# Patient Record
Sex: Male | Born: 1960 | Race: Black or African American | Hispanic: No | Marital: Single | Smoking: Never smoker
Health system: Southern US, Community
[De-identification: ages and names within clinical notes are randomized; demographics above are authoritative.]

## PROBLEM LIST (undated history)

## (undated) DIAGNOSIS — N200 Calculus of kidney: Secondary | ICD-10-CM

## (undated) HISTORY — PX: ANKLE SURGERY: SHX546

## (undated) HISTORY — PX: KNEE ARTHROPLASTY: SHX992

---

## 1998-03-09 ENCOUNTER — Emergency Department (HOSPITAL_COMMUNITY): Admission: EM | Admit: 1998-03-09 | Discharge: 1998-03-09 | Payer: Self-pay | Admitting: Emergency Medicine

## 2000-10-04 ENCOUNTER — Emergency Department (HOSPITAL_COMMUNITY): Admission: EM | Admit: 2000-10-04 | Discharge: 2000-10-04 | Payer: Self-pay | Admitting: *Deleted

## 2005-10-06 ENCOUNTER — Encounter: Admission: RE | Admit: 2005-10-06 | Discharge: 2005-10-06 | Payer: Self-pay | Admitting: Family Medicine

## 2005-10-12 ENCOUNTER — Encounter: Admission: RE | Admit: 2005-10-12 | Discharge: 2006-01-10 | Payer: Self-pay | Admitting: Family Medicine

## 2005-12-07 ENCOUNTER — Emergency Department (HOSPITAL_COMMUNITY): Admission: EM | Admit: 2005-12-07 | Discharge: 2005-12-07 | Payer: Self-pay | Admitting: Emergency Medicine

## 2005-12-08 ENCOUNTER — Ambulatory Visit (HOSPITAL_COMMUNITY): Admission: EM | Admit: 2005-12-08 | Discharge: 2005-12-08 | Payer: Self-pay | Admitting: Emergency Medicine

## 2011-04-03 ENCOUNTER — Emergency Department (HOSPITAL_COMMUNITY)
Admission: EM | Admit: 2011-04-03 | Discharge: 2011-04-03 | Disposition: A | Discharge status: Home / Self Care | Payer: BC Managed Care – PPO | Attending: Emergency Medicine | Admitting: Emergency Medicine

## 2011-04-03 ENCOUNTER — Emergency Department (HOSPITAL_COMMUNITY): Payer: BC Managed Care – PPO

## 2011-04-03 DIAGNOSIS — R112 Nausea with vomiting, unspecified: Secondary | ICD-10-CM | POA: Insufficient documentation

## 2011-04-03 DIAGNOSIS — R1915 Other abnormal bowel sounds: Secondary | ICD-10-CM | POA: Insufficient documentation

## 2011-04-03 DIAGNOSIS — N201 Calculus of ureter: Secondary | ICD-10-CM | POA: Insufficient documentation

## 2011-04-03 DIAGNOSIS — I10 Essential (primary) hypertension: Secondary | ICD-10-CM | POA: Insufficient documentation

## 2011-04-03 DIAGNOSIS — R109 Unspecified abdominal pain: Secondary | ICD-10-CM | POA: Insufficient documentation

## 2011-04-03 LAB — COMPREHENSIVE METABOLIC PANEL
ALT: 45 U/L (ref 0–53)
AST: 25 U/L (ref 0–37)
Calcium: 8.9 mg/dL (ref 8.4–10.5)
Sodium: 139 mEq/L (ref 135–145)
Total Protein: 6.7 g/dL (ref 6.0–8.3)

## 2011-04-03 LAB — CBC
MCH: 29.4 pg (ref 26.0–34.0)
MCHC: 34.7 g/dL (ref 30.0–36.0)
Platelets: 282 10*3/uL (ref 150–400)
RBC: 4.66 MIL/uL (ref 4.22–5.81)

## 2011-04-03 LAB — URINALYSIS, ROUTINE W REFLEX MICROSCOPIC
Glucose, UA: NEGATIVE mg/dL
Specific Gravity, Urine: 1.023 (ref 1.005–1.030)
Urobilinogen, UA: 0.2 mg/dL (ref 0.0–1.0)
pH: 6 (ref 5.0–8.0)

## 2011-04-03 LAB — DIFFERENTIAL
Basophils Absolute: 0 10*3/uL (ref 0.0–0.1)
Basophils Relative: 0 % (ref 0–1)
Eosinophils Absolute: 0.1 10*3/uL (ref 0.0–0.7)
Monocytes Absolute: 0.7 10*3/uL (ref 0.1–1.0)
Monocytes Relative: 6 % (ref 3–12)
Neutrophils Relative %: 72 % (ref 43–77)

## 2011-04-03 LAB — URINE MICROSCOPIC-ADD ON

## 2011-04-05 ENCOUNTER — Ambulatory Visit (HOSPITAL_COMMUNITY)
Admission: RE | Admit: 2011-04-05 | Discharge: 2011-04-05 | Disposition: A | Discharge status: Home / Self Care | Payer: BC Managed Care – PPO | Source: Ambulatory Visit | Attending: Urology | Admitting: Urology

## 2011-04-05 DIAGNOSIS — N201 Calculus of ureter: Secondary | ICD-10-CM | POA: Insufficient documentation

## 2011-04-05 DIAGNOSIS — N1330 Unspecified hydronephrosis: Secondary | ICD-10-CM | POA: Insufficient documentation

## 2011-04-11 NOTE — Op Note (Signed)
  NAMEGLOYD, HAPP NO.:  [id number]  MEDICAL RECORD NO.:  [id number]  LOCATION:  DAYL                         FACILITY:  [facility]  PHYSICIAN:  Martina Sinner, MD DATE OF BIRTH:  08/03/1961  DATE OF PROCEDURE:  04/05/2011 DATE OF DISCHARGE:                              OPERATIVE REPORT   PREOPERATIVE DIAGNOSIS:  Right ureteral stone.  POSTOPERATIVE DIAGNOSIS:  Right ureteral stone.  PROCEDURE:  Cystoscopy, right retrograde urethrogram, and insertion of right ureteral stent.  DESCRIPTION OF PROCEDURE:  Mr. Voiles has symptomatic colic not settling down on pain and nausea medication.  He has a 5-mm stone in the proximal right ureter with mild hydronephrosis.  He consented to the above procedure.  The patient was prepped and draped in usual fashion.  Preoperative ciprofloxacin was given.  A 21 scope was utilized.  Penile bulbar membranous urethra normal.  Bladder mucosa and trigone were normal. There was no periureteral edema.  Under fluoroscopic guidance, I easily passed a sensor wire to the mid ureter.  Over the wire, I passed an open-ended 6-French ureteral catheter and removed the wire.  I did retrograde urethrogram with the above technique in the AP position and lithotomy position.  Approximately 5 mL of contrast was utilized, not under pressure.  I could see a minimally dilated ureter and I could outline the calyces.  I then replaced the sensor wire and had a curl in the upper pole calyx under fluoroscopic guidance.  So, I removed the open-ended ureteral catheter.  Under cystoscopic and fluoroscopic guidance, I passed a 26 cm x 6-French double-J stent with no string, that curled up in the upper pole calyx into the bladder.  Bladder was emptied.  The patient taken to recovery room.  The patient will be followed with KUB and likely lithotripsy.          ______________________________ Martina Sinner, MD     SAM/MEDQ  D:  04/05/2011   T:  04/06/2011  Job:  [id number]  Electronically Signed by Alfredo Martinez MD on 04/11/2011 08:00:00 AM

## 2011-04-13 ENCOUNTER — Ambulatory Visit (HOSPITAL_COMMUNITY)
Admission: RE | Admit: 2011-04-13 | Discharge: 2011-04-13 | Disposition: A | Discharge status: Home / Self Care | Payer: BC Managed Care – PPO | Source: Ambulatory Visit | Attending: Urology | Admitting: Urology

## 2011-04-13 DIAGNOSIS — I10 Essential (primary) hypertension: Secondary | ICD-10-CM | POA: Insufficient documentation

## 2011-04-13 DIAGNOSIS — N200 Calculus of kidney: Secondary | ICD-10-CM | POA: Insufficient documentation

## 2011-04-13 DIAGNOSIS — Z79899 Other long term (current) drug therapy: Secondary | ICD-10-CM | POA: Insufficient documentation

## 2012-11-18 IMAGING — CT CT ABD-PELV W/O CM
3 of 4 series · 14 of 32 positions shown, 19 images · non-contrast
Comparison: CT 12/07/2005

CLINICAL DATA: Right sided pain

CT ABDOMEN AND PELVIS WITHOUT CONTRAST
TECHNIQUE: Multidetector CT imaging of the abdomen and pelvis was
performed following the standard protocol without intravenous
contrast.

[Series 2: renal stone · axial · 0.79mm/px · z∈[-413,-153]mm · 4 of 88 slices shown, 9 images]
[im 18/88  soft-tissue]
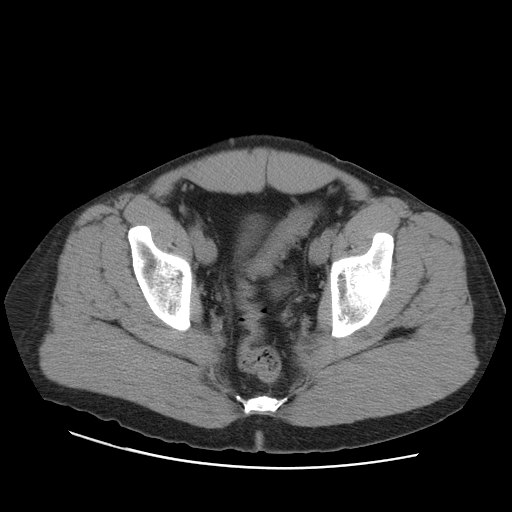
[im 18/88  lung]
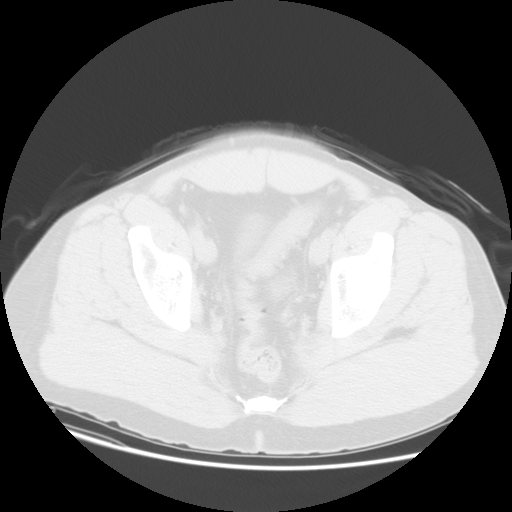
[im 18/88  bone]
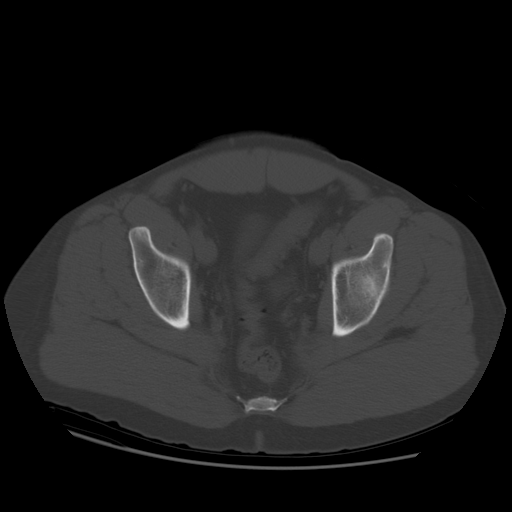
[im 35/88  soft-tissue]
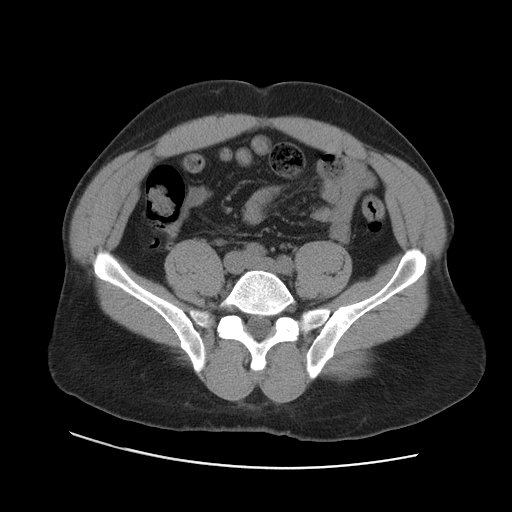
[im 35/88  lung]
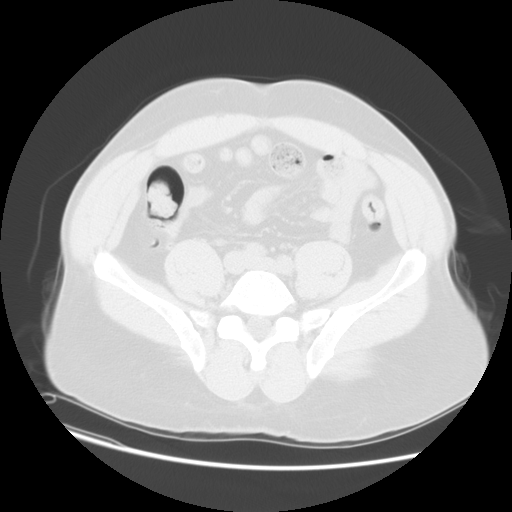
[im 53/88  soft-tissue]
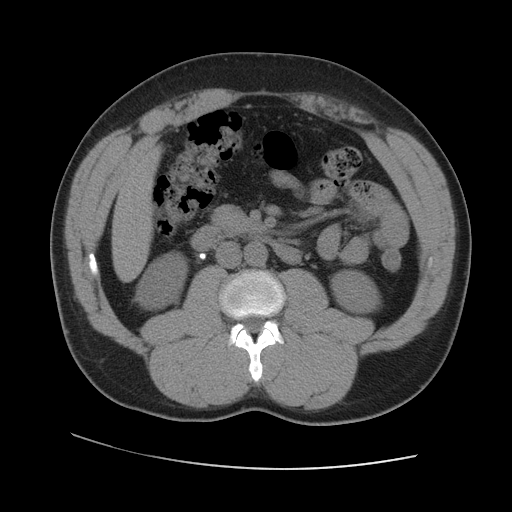
[im 53/88  lung]
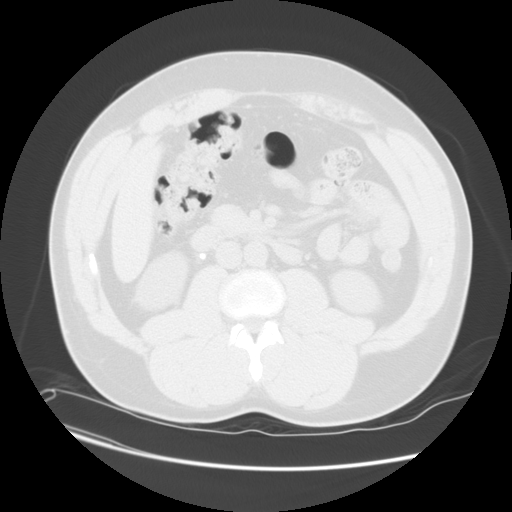
[im 70/88  soft-tissue]
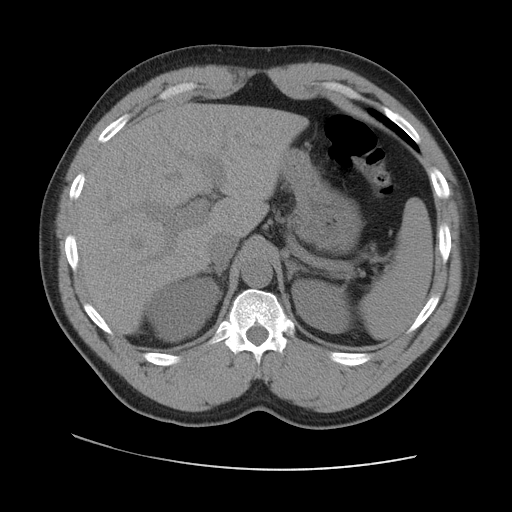
[im 70/88  lung]
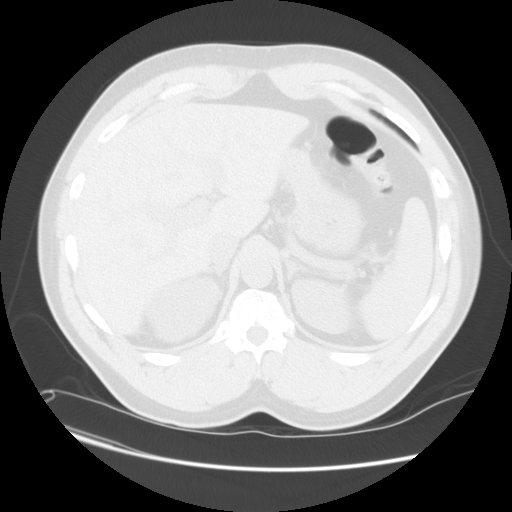

[Series 400: cor · coronal · 0.92mm/px · 2 of 149 slices shown]
[im 15/149  soft-tissue]
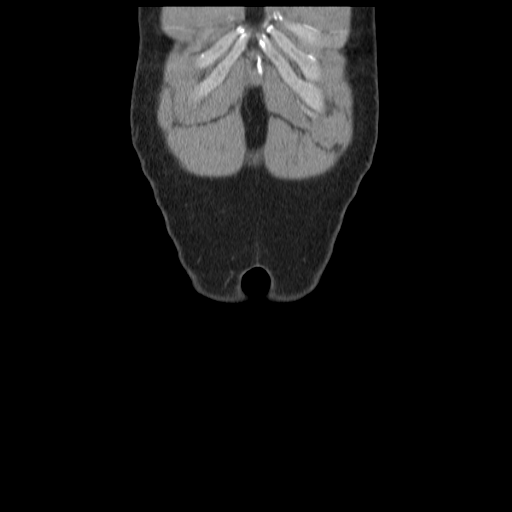
[im 30/149  soft-tissue]
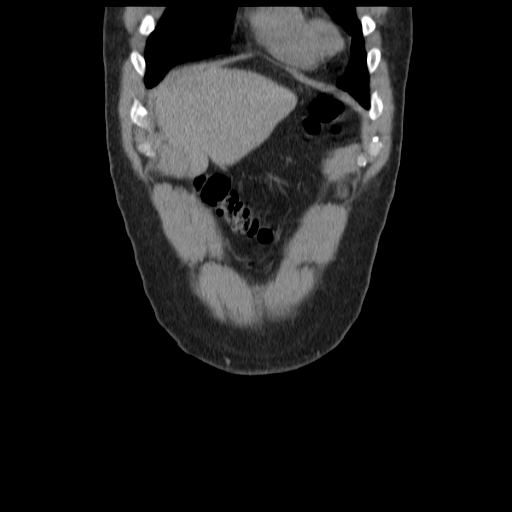

[Series 401: sag · sagittal · 0.92mm/px · 8 of 168 slices shown]
[im 14/168  soft-tissue]
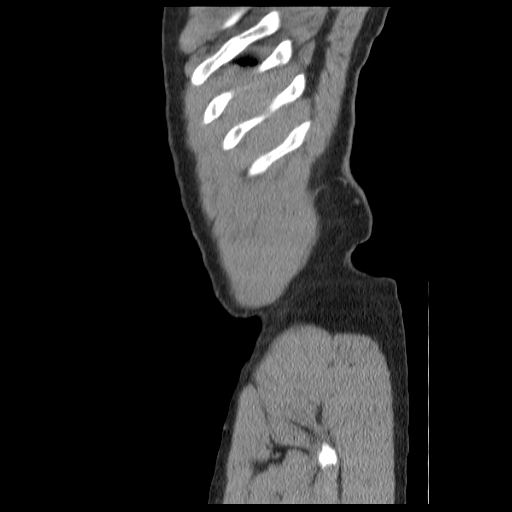
[im 42/168  soft-tissue]
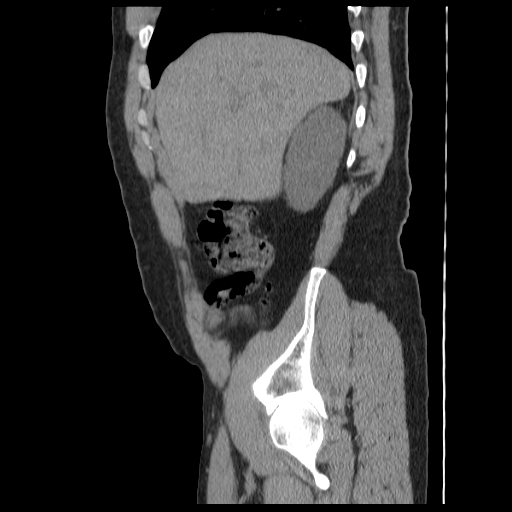
[im 56/168  soft-tissue]
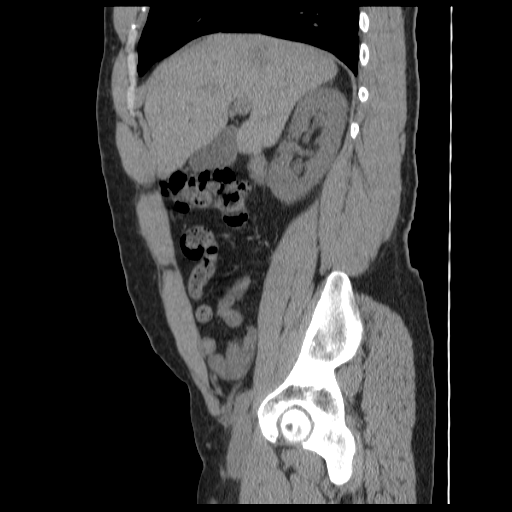
[im 70/168  soft-tissue]
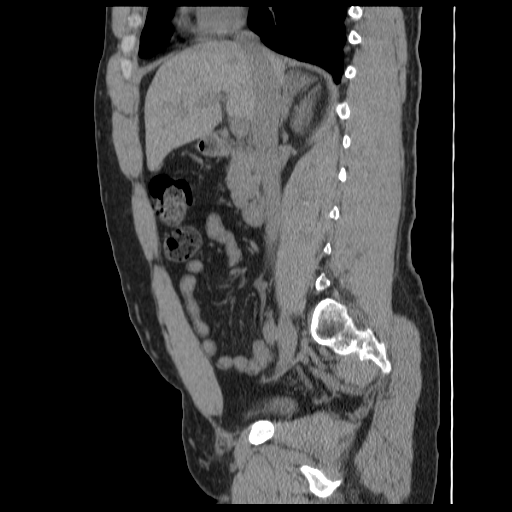
[im 98/168  soft-tissue]
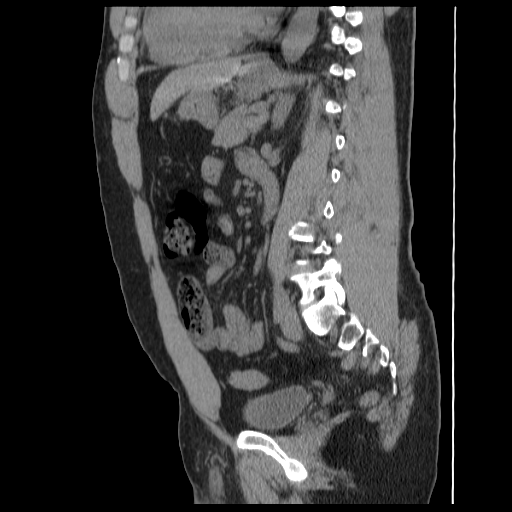
[im 112/168  soft-tissue]
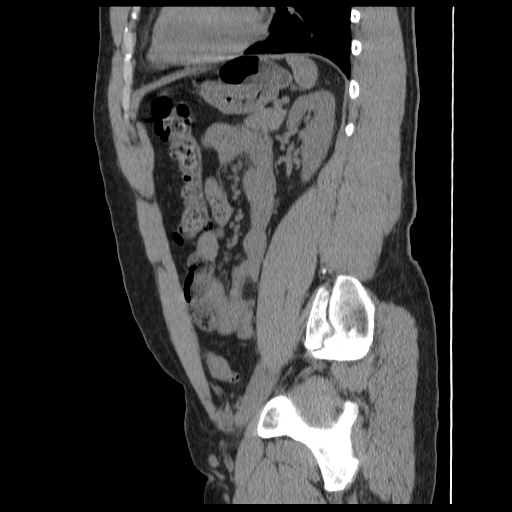
[im 126/168  soft-tissue]
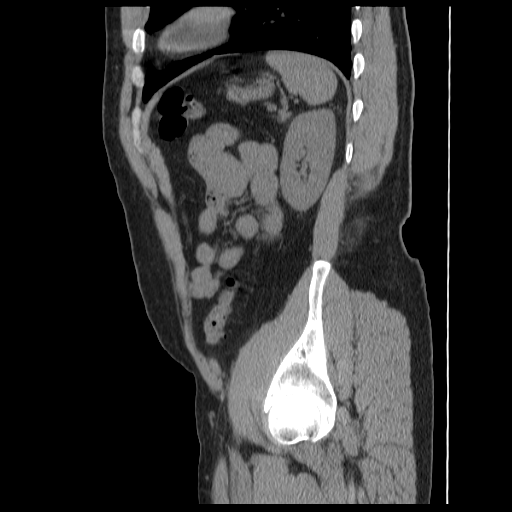
[im 154/168  soft-tissue]
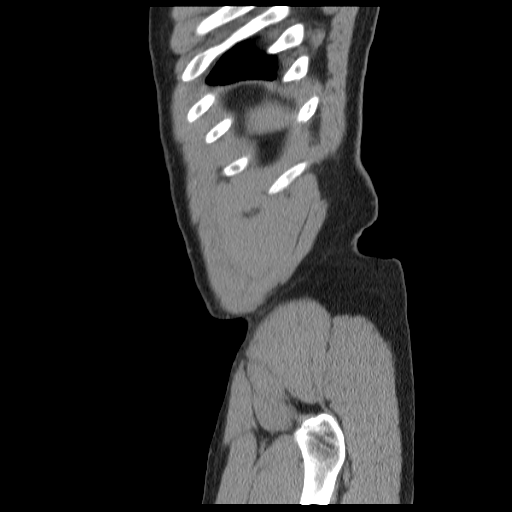

[14 of 32 positions shown; findings below may reference images not displayed]

FINDINGS: 4 x 5 mm calculus proximal right ureter causing partial
obstruction of the right kidney.  3 x 4 mm right lower pole stone
as well as smaller renal calculi bilaterally.  No obstruction of
the left kidney.

Liver spleen and pancreas are normal.  No bowel obstruction.
Appendix is normal.  Chronic diverticular change in the sigmoid
colon.
IMPRESSION: 4 x 5 mm calculus proximal right ureter causing partial obstruction
of the right kidney.

## 2013-10-23 ENCOUNTER — Emergency Department (HOSPITAL_COMMUNITY): Payer: BC Managed Care – PPO

## 2013-10-23 ENCOUNTER — Encounter (HOSPITAL_COMMUNITY): Payer: Self-pay | Admitting: Emergency Medicine

## 2013-10-23 DIAGNOSIS — J40 Bronchitis, not specified as acute or chronic: Secondary | ICD-10-CM | POA: Insufficient documentation

## 2013-10-23 DIAGNOSIS — M549 Dorsalgia, unspecified: Secondary | ICD-10-CM | POA: Insufficient documentation

## 2013-10-23 DIAGNOSIS — B9789 Other viral agents as the cause of diseases classified elsewhere: Secondary | ICD-10-CM | POA: Insufficient documentation

## 2013-10-23 DIAGNOSIS — Z8701 Personal history of pneumonia (recurrent): Secondary | ICD-10-CM | POA: Insufficient documentation

## 2013-10-23 LAB — I-STAT TROPONIN, ED: Troponin i, poc: 0.01 ng/mL (ref 0.00–0.08)

## 2013-10-23 NOTE — ED Notes (Signed)
Pt. reports mid chest pain and lower back pain onset yesterday with nausea and vomitting .

## 2013-10-24 ENCOUNTER — Emergency Department (HOSPITAL_COMMUNITY)
Admission: EM | Admit: 2013-10-24 | Discharge: 2013-10-24 | Disposition: A | Discharge status: Home / Self Care | Payer: BC Managed Care – PPO | Attending: Emergency Medicine | Admitting: Emergency Medicine

## 2013-10-24 DIAGNOSIS — J40 Bronchitis, not specified as acute or chronic: Secondary | ICD-10-CM

## 2013-10-24 DIAGNOSIS — B349 Viral infection, unspecified: Secondary | ICD-10-CM

## 2013-10-24 LAB — BASIC METABOLIC PANEL
BUN: 10 mg/dL (ref 6–23)
CHLORIDE: 101 meq/L (ref 96–112)
CO2: 28 mEq/L (ref 19–32)
Calcium: 9.3 mg/dL (ref 8.4–10.5)
Creatinine, Ser: 1.4 mg/dL — ABNORMAL HIGH (ref 0.50–1.35)
GFR, EST AFRICAN AMERICAN: 66 mL/min — AB (ref >90)
GFR, EST NON AFRICAN AMERICAN: 57 mL/min — AB (ref >90)
GLUCOSE: 116 mg/dL — AB (ref 70–99)
POTASSIUM: 4 meq/L (ref 3.7–5.3)
SODIUM: 141 meq/L (ref 137–147)

## 2013-10-24 LAB — CBC
HEMATOCRIT: 41.2 % (ref 39.0–52.0)
HEMOGLOBIN: 14.4 g/dL (ref 13.0–17.0)
MCH: 30.4 pg (ref 26.0–34.0)
MCHC: 35 g/dL (ref 30.0–36.0)
MCV: 86.9 fL (ref 78.0–100.0)
Platelets: 220 10*3/uL (ref 150–400)
RBC: 4.74 MIL/uL (ref 4.22–5.81)
RDW: 12.3 % (ref 11.5–15.5)
WBC: 5.2 10*3/uL (ref 4.0–10.5)

## 2013-10-24 MED ORDER — HYDROCOD POLST-CHLORPHEN POLST 10-8 MG/5ML PO LQCR
5.0000 mL | Freq: Once | ORAL | Status: AC
  Administered 2013-10-24: 5 mL via ORAL
  Filled 2013-10-24: qty 5

## 2013-10-24 MED ORDER — HYDROCOD POLST-CHLORPHEN POLST 10-8 MG/5ML PO LQCR: 5.0000 mL | Freq: Two times a day (BID) | ORAL | Status: DC | PRN

## 2013-10-24 MED ORDER — BENZONATATE 100 MG PO CAPS
200.0000 mg | ORAL_CAPSULE | Freq: Three times a day (TID) | ORAL | Status: DC | PRN
  Administered 2013-10-24: 200 mg via ORAL
  Filled 2013-10-24: qty 2

## 2013-10-24 MED ORDER — ONDANSETRON 8 MG PO TBDP: 8.0000 mg | ORAL_TABLET | Freq: Three times a day (TID) | ORAL | Status: AC | PRN

## 2013-10-24 MED ORDER — BENZONATATE 100 MG PO CAPS: 100.0000 mg | ORAL_CAPSULE | Freq: Three times a day (TID) | ORAL | Status: DC

## 2013-10-24 MED ORDER — ALBUTEROL SULFATE HFA 108 (90 BASE) MCG/ACT IN AERS
1.0000 | INHALATION_SPRAY | RESPIRATORY_TRACT | Status: DC | PRN
  Administered 2013-10-24: 1 via RESPIRATORY_TRACT
  Filled 2013-10-24: qty 6.7

## 2013-10-24 MED ORDER — ONDANSETRON HCL 4 MG/2ML IJ SOLN
4.0000 mg | Freq: Once | INTRAMUSCULAR | Status: AC
  Administered 2013-10-24: 4 mg via INTRAVENOUS
  Filled 2013-10-24: qty 2

## 2013-10-24 NOTE — Discharge Instructions (Signed)
Take medications as prescribed.  Alternate tylenol and motrin every 4-6 hours as needed for body aches, fever.  Drink plenty of fluids.  Rest!!   Bronchitis Bronchitis is inflammation of the airways that extend from the windpipe into the lungs (bronchi). The inflammation often causes mucus to develop, which leads to a cough. If the inflammation becomes severe, it may cause shortness of breath. CAUSES  Bronchitis may be caused by:   Viral infections.   Bacteria.   Cigarette smoke.   Allergens, pollutants, and other irritants.  SIGNS AND SYMPTOMS  The most common symptom of bronchitis is a frequent cough that produces mucus. Other symptoms include:  Fever.   Body aches.   Chest congestion.   Chills.   Shortness of breath.   Sore throat.  DIAGNOSIS  Bronchitis is usually diagnosed through a medical history and physical exam. Tests, such as chest X-rays, are sometimes done to rule out other conditions.  TREATMENT  You may need to avoid contact with whatever caused the problem (smoking, for example). Medicines are sometimes needed. These may include:  Antibiotics. These may be prescribed if the condition is caused by bacteria.  Cough suppressants. These may be prescribed for relief of cough symptoms.   Inhaled medicines. These may be prescribed to help open your airways and make it easier for you to breathe.   Steroid medicines. These may be prescribed for those with recurrent (chronic) bronchitis. HOME CARE INSTRUCTIONS  Get plenty of rest.   Drink enough fluids to keep your urine clear or pale yellow (unless you have a medical condition that requires fluid restriction). Increasing fluids may help thin your secretions and will prevent dehydration.   Only take over-the-counter or prescription medicines as directed by your health care provider.  Only take antibiotics as directed. Make sure you finish them even if you start to feel better.  Avoid secondhand  smoke, irritating chemicals, and strong fumes. These will make bronchitis worse. If you are a smoker, quit smoking. Consider using nicotine gum or skin patches to help control withdrawal symptoms. Quitting smoking will help your lungs heal faster.   Put a cool-mist humidifier in your bedroom at night to moisten the air. This may help loosen mucus. Change the water in the humidifier daily. You can also run the hot water in your shower and sit in the bathroom with the door closed for 5 10 minutes.   Follow up with your health care provider as directed.   Wash your hands frequently to avoid catching bronchitis again or spreading an infection to others.  SEEK MEDICAL CARE IF: Your symptoms do not improve after 1 week of treatment.  SEEK IMMEDIATE MEDICAL CARE IF:  Your fever increases.  You have chills.   You have chest pain.   You have worsening shortness of breath.   You have bloody sputum.  You faint.  You have lightheadedness.  You have a severe headache.   You vomit repeatedly. MAKE SURE YOU:   Understand these instructions.  Will watch your condition.  Will get help right away if you are not doing well or get worse. Document Released: 08/21/2005 Document Revised: 06/11/2013 Document Reviewed: 04/15/2013 [facility] Patient Information 2014 [employer], [place].  Viral Infections A viral infection can be caused by different types of viruses.Most viral infections are not serious and resolve on their own. However, some infections may cause severe symptoms and may lead to further complications. SYMPTOMS Viruses can frequently cause:  Minor sore throat.  Aches and pains.  Headaches.  Runny nose.  Different types of rashes.  Watery eyes.  Tiredness.  Cough.  Loss of appetite.  Gastrointestinal infections, resulting in nausea, vomiting, and diarrhea. These symptoms do not respond to antibiotics because the infection is not caused by bacteria. However, you  might catch a bacterial infection following the viral infection. This is sometimes called a "superinfection." Symptoms of such a bacterial infection may include:  Worsening sore throat with pus and difficulty swallowing.  Swollen neck glands.  Chills and a high or persistent fever.  Severe headache.  Tenderness over the sinuses.  Persistent overall ill feeling (malaise), muscle aches, and tiredness (fatigue).  Persistent cough.  Yellow, green, or brown mucus production with coughing. HOME CARE INSTRUCTIONS   Only take over-the-counter or prescription medicines for pain, discomfort, diarrhea, or fever as directed by your caregiver.  Drink enough water and fluids to keep your urine clear or pale yellow. Sports drinks can provide valuable electrolytes, sugars, and hydration.  Get plenty of rest and maintain proper nutrition. Soups and broths with crackers or rice are fine. SEEK IMMEDIATE MEDICAL CARE IF:   You have severe headaches, shortness of breath, chest pain, neck pain, or an unusual rash.  You have uncontrolled vomiting, diarrhea, or you are unable to keep down fluids.  You or your child has an oral temperature above 102 F (38.9 C), not controlled by medicine.  Your baby is older than 3 months with a rectal temperature of 102 F (38.9 C) or higher.  Your baby is 68 months old or younger with a rectal temperature of 100.4 F (38 C) or higher. MAKE SURE YOU:   Understand these instructions.  Will watch your condition.  Will get help right away if you are not doing well or get worse. Document Released: 05/31/2005 Document Revised: 11/13/2011 Document Reviewed: 12/26/2010 [facility] Patient Information 2014 [place], [place].

## 2013-10-24 NOTE — ED Notes (Signed)
Pt states that this morning he woke up with a headache, nasal congestion. Pt states that he then started experiencing back pain, chest pain and abd pain. States that he tried to drink some water and started vomitting. States that he had a temp of 102 at 1400 and took two motrin. Pts wife states that this afternoon pt was shaky and had cold chills.

## 2013-10-24 NOTE — ED Provider Notes (Signed)
CSN: [id number]     Arrival date & time 10/23/13  2302 History   First MD Initiated Contact with Patient 10/24/13 [phone]     Chief Complaint  Patient presents with  . Chest Pain  . Back Pain     (Consider location/radiation/quality/duration/timing/severity/associated sxs/prior Treatment) HPI 53 yo male presents to the ER from home with complaint of one day of cough, back and chest pain, headache, nasal congestion, post tussive emesis, and fever to 102.  Pt is not a smoker, no h/o asthma, COPD.  No known sick contacts.  Pt reports body aches, chills.  He is concerned about pneumonia, has remote history of same.   History reviewed. No pertinent past medical history. Past Surgical History  Procedure Laterality Date  . Ankle surgery     No family history on file. History  Substance Use Topics  . Smoking status: Never Smoker   . Smokeless tobacco: Not on file  . Alcohol Use: No    Review of Systems  All other systems reviewed and are negative.      Allergies  Review of patient's allergies indicates no known allergies.  Home Medications   Current Outpatient Rx  Name  Route  Sig  Dispense  Refill  . ibuprofen (ADVIL,MOTRIN) 200 MG tablet   Oral   Take 200 mg by mouth every 6 (six) hours as needed for mild pain.         . benzonatate (TESSALON) 100 MG capsule   Oral   Take 1 capsule (100 mg total) by mouth every 8 (eight) hours.   21 capsule   0   . chlorpheniramine-HYDROcodone (TUSSIONEX PENNKINETIC ER) 10-8 MG/5ML LQCR   Oral   Take 5 mLs by mouth every 12 (twelve) hours as needed for cough.   140 mL   0   . ondansetron (ZOFRAN ODT) 8 MG disintegrating tablet   Oral   Take 1 tablet (8 mg total) by mouth every 8 (eight) hours as needed for nausea or vomiting.   20 tablet   0    BP 151/89  Pulse 104  Temp(Src) 98.6 F (37 C) (Oral)  Resp 18  Ht 5\' 11"  (1.803 m)  Wt 225 lb (102.059 kg)  BMI 31.39 kg/m2  SpO2 96% Physical Exam  Nursing note and vitals  reviewed. Constitutional: He is oriented to person, place, and time. He appears well-developed and well-nourished. He appears distressed.  HENT:  Head: Normocephalic and atraumatic.  Right Ear: External ear normal.  Left Ear: External ear normal.  Mouth/Throat: Oropharynx is clear and moist.  Nasal congestion  Eyes: Conjunctivae and EOM are normal. Pupils are equal, round, and reactive to light.  Neck: Normal range of motion. Neck supple. No JVD present. No tracheal deviation present. No thyromegaly present.  Cardiovascular: Normal rate, regular rhythm, normal heart sounds and intact distal pulses.  Exam reveals no gallop and no friction rub.   No murmur heard. Pulmonary/Chest: Effort normal and breath sounds normal. No stridor. No respiratory distress. He has no wheezes. He has no rales. He exhibits no tenderness.  Coughing noted  Abdominal: Soft. Bowel sounds are normal. He exhibits no distension and no mass. There is no tenderness. There is no rebound and no guarding.  Musculoskeletal: Normal range of motion. He exhibits no edema and no tenderness.  Lymphadenopathy:    He has no cervical adenopathy.  Neurological: He is alert and oriented to person, place, and time. He exhibits normal muscle tone. Coordination normal.  Skin:  Skin is warm and dry. No rash noted. No erythema. No pallor.  Psychiatric: He has a normal mood and affect. His behavior is normal. Judgment and thought content normal.    ED Course  Procedures (including critical care time) Labs Review Labs Reviewed  BASIC METABOLIC PANEL - Abnormal; Notable for the following:    Glucose, Bld 116 (*)    Creatinine, Ser 1.40 (*)    GFR calc non Af Amer 57 (*)    GFR calc Af Amer 66 (*)    All other components within normal limits  CBC  I-STAT TROPOININ, ED   Imaging Review Dg Chest 2 View  10/24/2013   CLINICAL DATA:  CHEST PAIN BACK PAIN  EXAM: CHEST  2 VIEW  COMPARISON:  DG CERVICAL SPINE 2-3V dated 05/07/2012; DG CHEST  1V PORT dated 12/07/2005  FINDINGS: Normal mediastinum and cardiac silhouette. Chronic central bronchitic markings. Normal pulmonary vasculature. No effusion, infiltrate, or pneumothorax.  IMPRESSION: Chronic bronchitic markings.  No acute findings.   Electronically Signed   By: Genevive Bi M.D.   On: 10/24/2013 00:07    EKG Interpretation    Date/Time:  Thursday October 23 2013 23:09:06 EST Ventricular Rate:  104 PR Interval:  166 QRS Duration: 102 QT Interval:  346 QTC Calculation: 454 R Axis:   -77 Text Interpretation:  Sinus tachycardia Left axis deviation Pulmonary disease pattern Abnormal ECG Since last tracing rate faster Confirmed by Jabree Rebert  MD, Airabella Barley ([id number]) on 10/24/2013 1:22:57 AM            MDM   Final diagnoses:  Bronchitis  Viral syndrome    53 year old male with cough, congestion, and posttussive emesis.  Signs of bronchitis on chest x-ray.  Labs otherwise unremarkable.  Plan to treat symptomatically.  Patient is happy with this plan.    Olivia Mackie, MD 10/24/13 [phone]

## 2014-04-12 ENCOUNTER — Emergency Department (HOSPITAL_COMMUNITY): Payer: BC Managed Care – PPO

## 2014-04-12 ENCOUNTER — Emergency Department (HOSPITAL_COMMUNITY)
Admission: EM | Admit: 2014-04-12 | Discharge: 2014-04-12 | Disposition: A | Discharge status: Home / Self Care | Payer: BC Managed Care – PPO | Attending: Emergency Medicine | Admitting: Emergency Medicine

## 2014-04-12 ENCOUNTER — Encounter (HOSPITAL_COMMUNITY): Payer: Self-pay | Admitting: Emergency Medicine

## 2014-04-12 DIAGNOSIS — R319 Hematuria, unspecified: Secondary | ICD-10-CM

## 2014-04-12 DIAGNOSIS — R112 Nausea with vomiting, unspecified: Secondary | ICD-10-CM | POA: Insufficient documentation

## 2014-04-12 DIAGNOSIS — R0602 Shortness of breath: Secondary | ICD-10-CM | POA: Insufficient documentation

## 2014-04-12 DIAGNOSIS — Z87442 Personal history of urinary calculi: Secondary | ICD-10-CM | POA: Insufficient documentation

## 2014-04-12 DIAGNOSIS — R079 Chest pain, unspecified: Secondary | ICD-10-CM | POA: Insufficient documentation

## 2014-04-12 DIAGNOSIS — R61 Generalized hyperhidrosis: Secondary | ICD-10-CM | POA: Insufficient documentation

## 2014-04-12 DIAGNOSIS — I499 Cardiac arrhythmia, unspecified: Secondary | ICD-10-CM | POA: Insufficient documentation

## 2014-04-12 DIAGNOSIS — Z79899 Other long term (current) drug therapy: Secondary | ICD-10-CM | POA: Insufficient documentation

## 2014-04-12 DIAGNOSIS — R1084 Generalized abdominal pain: Secondary | ICD-10-CM | POA: Insufficient documentation

## 2014-04-12 DIAGNOSIS — R231 Pallor: Secondary | ICD-10-CM | POA: Insufficient documentation

## 2014-04-12 HISTORY — DX: Calculus of kidney: N20.0

## 2014-04-12 LAB — COMPREHENSIVE METABOLIC PANEL
ALT: 23 U/L (ref 0–53)
AST: 23 U/L (ref 0–37)
Albumin: 4.4 g/dL (ref 3.5–5.2)
Alkaline Phosphatase: 69 U/L (ref 39–117)
Anion gap: 14 (ref 5–15)
BILIRUBIN TOTAL: 0.5 mg/dL (ref 0.3–1.2)
BUN: 9 mg/dL (ref 6–23)
CALCIUM: 9.5 mg/dL (ref 8.4–10.5)
CHLORIDE: 104 meq/L (ref 96–112)
CO2: 25 meq/L (ref 19–32)
Creatinine, Ser: 1.34 mg/dL (ref 0.50–1.35)
GFR, EST AFRICAN AMERICAN: 69 mL/min — AB (ref >90)
GFR, EST NON AFRICAN AMERICAN: 60 mL/min — AB (ref >90)
Glucose, Bld: 129 mg/dL — ABNORMAL HIGH (ref 70–99)
Potassium: 4.1 mEq/L (ref 3.7–5.3)
SODIUM: 143 meq/L (ref 137–147)
Total Protein: 7.7 g/dL (ref 6.0–8.3)

## 2014-04-12 LAB — LIPASE, BLOOD: Lipase: 24 U/L (ref 11–59)

## 2014-04-12 LAB — URINALYSIS, ROUTINE W REFLEX MICROSCOPIC
Bilirubin Urine: NEGATIVE
GLUCOSE, UA: NEGATIVE mg/dL
Ketones, ur: 15 mg/dL — AB
Leukocytes, UA: NEGATIVE
Nitrite: NEGATIVE
Protein, ur: NEGATIVE mg/dL
SPECIFIC GRAVITY, URINE: 1.038 — AB (ref 1.005–1.030)
UROBILINOGEN UA: 0.2 mg/dL (ref 0.0–1.0)
pH: 8 (ref 5.0–8.0)

## 2014-04-12 LAB — CBC WITH DIFFERENTIAL/PLATELET
Basophils Absolute: 0 10*3/uL (ref 0.0–0.1)
Basophils Relative: 0 % (ref 0–1)
Eosinophils Absolute: 0 10*3/uL (ref 0.0–0.7)
Eosinophils Relative: 0 % (ref 0–5)
HEMATOCRIT: 45.2 % (ref 39.0–52.0)
Hemoglobin: 15.5 g/dL (ref 13.0–17.0)
LYMPHS ABS: 0.7 10*3/uL (ref 0.7–4.0)
LYMPHS PCT: 8 % — AB (ref 12–46)
MCH: 30.3 pg (ref 26.0–34.0)
MCHC: 34.3 g/dL (ref 30.0–36.0)
MCV: 88.5 fL (ref 78.0–100.0)
Monocytes Absolute: 0.3 10*3/uL (ref 0.1–1.0)
Monocytes Relative: 4 % (ref 3–12)
Neutro Abs: 7.7 10*3/uL (ref 1.7–7.7)
Neutrophils Relative %: 88 % — ABNORMAL HIGH (ref 43–77)
PLATELETS: 281 10*3/uL (ref 150–400)
RBC: 5.11 MIL/uL (ref 4.22–5.81)
RDW: 12.5 % (ref 11.5–15.5)
WBC: 8.7 10*3/uL (ref 4.0–10.5)

## 2014-04-12 LAB — I-STAT CHEM 8, ED
BUN: 8 mg/dL (ref 6–23)
CHLORIDE: 103 meq/L (ref 96–112)
Calcium, Ion: 1.16 mmol/L (ref 1.12–1.23)
Creatinine, Ser: 1.5 mg/dL — ABNORMAL HIGH (ref 0.50–1.35)
GLUCOSE: 137 mg/dL — AB (ref 70–99)
HCT: 48 % (ref 39.0–52.0)
Hemoglobin: 16.3 g/dL (ref 13.0–17.0)
Potassium: 3.9 mEq/L (ref 3.7–5.3)
Sodium: 142 mEq/L (ref 137–147)
TCO2: 23 mmol/L (ref 0–100)

## 2014-04-12 LAB — TROPONIN I: Troponin I: <0.3 ng/mL (ref <0.30)

## 2014-04-12 LAB — I-STAT TROPONIN, ED: Troponin i, poc: 0 ng/mL (ref 0.00–0.08)

## 2014-04-12 LAB — URINE MICROSCOPIC-ADD ON

## 2014-04-12 LAB — CBG MONITORING, ED: Glucose-Capillary: 101 mg/dL — ABNORMAL HIGH (ref 70–99)

## 2014-04-12 MED ORDER — HYDROMORPHONE HCL PF 1 MG/ML IJ SOLN
1.0000 mg | Freq: Once | INTRAMUSCULAR | Status: AC
  Administered 2014-04-12: 1 mg via INTRAVENOUS
  Filled 2014-04-12: qty 1

## 2014-04-12 MED ORDER — OXYCODONE-ACETAMINOPHEN 5-325 MG PO TABS: 1.0000 | ORAL_TABLET | ORAL | Status: AC | PRN

## 2014-04-12 MED ORDER — IOHEXOL 300 MG/ML  SOLN: 25.0000 mL | Freq: Once | INTRAMUSCULAR | Status: DC | PRN

## 2014-04-12 MED ORDER — ACETAMINOPHEN 325 MG PO TABS
650.0000 mg | ORAL_TABLET | Freq: Once | ORAL | Status: AC
  Administered 2014-04-12: 650 mg via ORAL
  Filled 2014-04-12: qty 2

## 2014-04-12 MED ORDER — OXYCODONE-ACETAMINOPHEN 5-325 MG PO TABS
2.0000 | ORAL_TABLET | Freq: Once | ORAL | Status: AC
  Administered 2014-04-12: 2 via ORAL
  Filled 2014-04-12: qty 2

## 2014-04-12 MED ORDER — IOHEXOL 300 MG/ML  SOLN
100.0000 mL | Freq: Once | INTRAMUSCULAR | Status: AC | PRN
  Administered 2014-04-12: 100 mL via INTRAVENOUS

## 2014-04-12 MED ORDER — KETOROLAC TROMETHAMINE 30 MG/ML IJ SOLN
30.0000 mg | Freq: Once | INTRAMUSCULAR | Status: AC
  Administered 2014-04-12: 30 mg via INTRAVENOUS
  Filled 2014-04-12: qty 1

## 2014-04-12 MED ORDER — HYDROMORPHONE HCL PF 1 MG/ML IJ SOLN
1.0000 mg | Freq: Once | INTRAMUSCULAR | Status: DC
  Filled 2014-04-12: qty 1

## 2014-04-12 MED ORDER — ASPIRIN 81 MG PO CHEW
324.0000 mg | CHEWABLE_TABLET | Freq: Once | ORAL | Status: AC
  Administered 2014-04-12: 324 mg via ORAL
  Filled 2014-04-12: qty 4

## 2014-04-12 MED ORDER — ONDANSETRON 8 MG PO TBDP: ORAL_TABLET | ORAL | Status: AC

## 2014-04-12 MED ORDER — SODIUM CHLORIDE 0.9 % IV BOLUS (SEPSIS)
2000.0000 mL | Freq: Once | INTRAVENOUS | Status: AC
  Administered 2014-04-12: 2000 mL via INTRAVENOUS

## 2014-04-12 MED ORDER — SODIUM CHLORIDE 0.9 % IV BOLUS (SEPSIS)
1000.0000 mL | Freq: Once | INTRAVENOUS | Status: AC
  Administered 2014-04-12: 1000 mL via INTRAVENOUS

## 2014-04-12 MED ORDER — PROMETHAZINE HCL 25 MG/ML IJ SOLN
12.5000 mg | Freq: Once | INTRAMUSCULAR | Status: AC
  Administered 2014-04-12: 12.5 mg via INTRAVENOUS
  Filled 2014-04-12: qty 1

## 2014-04-12 NOTE — Discharge Instructions (Signed)
1. Medications: percocet, zofran, usual home medications 2. Treatment: rest, drink plenty of fluids,  3. Follow Up: Please followup with your primary doctor in 2 days for discussion of your diagnoses and further evaluation after today's visit; if you do not have a primary care doctor use the resource guide provided to find one;     Pain of Unknown Etiology (Pain Without a Known Cause) You have come to your caregiver because of pain. Pain can occur in any part of the body. Often there is not a definite cause. If your laboratory (blood or urine) work was normal and X-rays or other studies were normal, your caregiver may treat you without knowing the cause of the pain. An example of this is the headache. Most headaches are diagnosed by taking a history. This means your caregiver asks you questions about your headaches. Your caregiver determines a treatment based on your answers. Usually testing done for headaches is normal. Often testing is not done unless there is no response to medications. Regardless of where your pain is located today, you can be given medications to make you comfortable. If no physical cause of pain can be found, most cases of pain will gradually leave as suddenly as they came.  If you have a painful condition and no reason can be found for the pain, it is important that you follow up with your caregiver. If the pain becomes worse or does not go away, it may be necessary to repeat tests and look further for a possible cause.  Only take over-the-counter or prescription medicines for pain, discomfort, or fever as directed by your caregiver.  For the protection of your privacy, test results cannot be given over the phone. Make sure you receive the results of your test. Ask how these results are to be obtained if you have not been informed. It is your responsibility to obtain your test results.  You may continue all activities unless the activities cause more pain. When the pain lessens,  it is important to gradually resume normal activities. Resume activities by beginning slowly and gradually increasing the intensity and duration of the activities or exercise. During periods of severe pain, bed rest may be helpful. Lie or sit in any position that is comfortable.  Ice used for acute (sudden) conditions may be effective. Use a large plastic bag filled with ice and wrapped in a towel. This may provide pain relief.  See your caregiver for continued problems. Your caregiver can help or refer you for exercises or physical therapy if necessary. If you were given medications for your condition, do not drive, operate machinery or power tools, or sign legal documents for 24 hours. Do not drink alcohol, take sleeping pills, or take other medications that may interfere with treatment. See your caregiver immediately if you have pain that is becoming worse and not relieved by medications. Document Released: 05/16/2001 Document Revised: 06/11/2013 Document Reviewed: 08/21/2005 [facility] Patient Information 2015 [place], [place]. This information is not intended to replace advice given to you by your health care provider. Make sure you discuss any questions you have with your health care provider.

## 2014-04-12 NOTE — ED Notes (Signed)
RN transported patients to scans

## 2014-04-12 NOTE — ED Notes (Signed)
Hannah PA-C at bedside.  

## 2014-04-12 NOTE — ED Notes (Signed)
CBG = 101. Nurse informed.

## 2014-04-12 NOTE — ED Provider Notes (Signed)
CSN: [id number]     Arrival date & time 04/12/14  1629 History   First MD Initiated Contact with Patient 04/12/14 1714     Chief Complaint  Patient presents with  . Abdominal Pain     (Consider location/radiation/quality/duration/timing/severity/associated sxs/prior Treatment) Patient is a 53 y.o. male presenting with abdominal pain. The history is provided by the patient and medical records. No language interpreter was used.  Abdominal Pain Associated symptoms: chest pain, nausea, shortness of breath and vomiting   Associated symptoms: no constipation, no cough, no diarrhea, no dysuria, no fatigue, no fever and no hematuria     Scott Hughes is a 53 y.o. male  with a hx of kidney stones presents to the Emergency Department complaining of gradual, persistent, progressively worsening generalized abd pain with associated CP, SOB, N/V onset 1:30PM. Patient also endorses 2 episodes of loose stools. He relates a history of kidney stones but states that this pain is very different from his kidney stone pain. Nothing seems to make it better or worse. Patient arrives via EMS he reports he received 4 mg of Zofran IV without relief.  Patient ill-appearing, writhing in the bed, pale and diaphoretic.   Patient denies fever, chills, headache, neck pain, syncope, dysuria, hematuria.  Past Medical History  Diagnosis Date  . Kidney calculi    Past Surgical History  Procedure Laterality Date  . Ankle surgery    . Knee arthroplasty Right    No family history on file. History  Substance Use Topics  . Smoking status: Never Smoker   . Smokeless tobacco: Not on file  . Alcohol Use: No    Review of Systems  Constitutional: Positive for diaphoresis. Negative for fever, appetite change, fatigue and unexpected weight change.  HENT: Negative for mouth sores.   Eyes: Negative for visual disturbance.  Respiratory: Positive for shortness of breath. Negative for cough, chest tightness and wheezing.    Cardiovascular: Positive for chest pain.  Gastrointestinal: Positive for nausea, vomiting and abdominal pain. Negative for diarrhea and constipation.  Endocrine: Negative for polydipsia, polyphagia and polyuria.  Genitourinary: Negative for dysuria, urgency, frequency and hematuria.  Musculoskeletal: Negative for back pain and neck stiffness.  Skin: Positive for pallor. Negative for rash.  Allergic/Immunologic: Negative for immunocompromised state.  Neurological: Negative for syncope, light-headedness and headaches.  Hematological: Does not bruise/bleed easily.  Psychiatric/Behavioral: Negative for sleep disturbance. The patient is not nervous/anxious.       Allergies  Review of patient's allergies indicates no known allergies.  Home Medications   Prior to Admission medications   Medication Sig Start Date End Date Taking? Authorizing Provider  ibuprofen (ADVIL,MOTRIN) 200 MG tablet Take 400 mg by mouth every 6 (six) hours as needed for headache, mild pain or moderate pain.    Yes Historical Provider, MD  Menthol, Topical Analgesic, (BENGAY EX) Apply 1 application topically daily as needed (for pain).   Yes Historical Provider, MD  ondansetron (ZOFRAN ODT) 8 MG disintegrating tablet Take 1 tablet (8 mg total) by mouth every 8 (eight) hours as needed for nausea or vomiting. 10/24/13  Yes Olivia Mackie, MD  Tetrahydrozoline HCl (EYE DROPS OP) Place 2 drops into both eyes daily as needed (for eye irritation).   Yes Historical Provider, MD  ondansetron (ZOFRAN ODT) 8 MG disintegrating tablet 8mg  ODT q4 hours prn nausea 04/12/14   Dahlia Client Eduar Kumpf, PA-C  oxyCODONE-acetaminophen (PERCOCET/ROXICET) 5-325 MG per tablet Take 1-2 tablets by mouth every 4 (four) hours as needed for moderate pain  or severe pain. 04/12/14   Sianne Tejada, PA-C   BP 130/74  Pulse 104  Temp(Src) 100 F (37.8 C) (Rectal)  Resp 16  Ht 5\' 11"  (1.803 m)  Wt 215 lb (97.523 kg)  BMI 30.00 kg/m2  SpO2 99% Physical  Exam  Nursing note and vitals reviewed. Constitutional: He is oriented to person, place, and time. He appears well-developed and well-nourished. He appears distressed.  Awake, alert, ill appearing, diaphoretic, pale, writhing in the bed  HENT:  Head: Normocephalic and atraumatic.  Mouth/Throat: Oropharynx is clear and moist. No oropharyngeal exudate.  Eyes: Conjunctivae are normal. No scleral icterus.  Neck: Normal range of motion. Neck supple.  Cardiovascular: S1 normal, S2 normal, normal heart sounds and intact distal pulses.  An irregular rhythm present.  No murmur heard. Pulses:      Radial pulses are 2+ on the right side, and 2+ on the left side.       Dorsalis pedis pulses are 2+ on the right side, and 2+ on the left side.       Posterior tibial pulses are 2+ on the right side, and 2+ on the left side.  Rate variable from 62-108 Irregular rhythm Capillary refill < 3 sec all extremities with 2+ pulses in all extremities  Pulmonary/Chest: Effort normal and breath sounds normal. No accessory muscle usage. Not tachypneic. No respiratory distress. He has no decreased breath sounds. He has no wheezes. He has no rhonchi. He has no rales.  Equal chest expansion Clear and equal breath sounds No TTP of the chest  Abdominal: Soft. Bowel sounds are normal. He exhibits no mass. There is tenderness. There is no rebound and no guarding.  Generalized tenderness and voluntary guarding on exam; no peritoneal signs  Musculoskeletal: Normal range of motion. He exhibits no edema.  No peripheral edema  Neurological: He is alert and oriented to person, place, and time. He exhibits normal muscle tone. Coordination normal.  Speech is clear and goal oriented Moves extremities without ataxia  Skin: Skin is warm. No rash noted. He is diaphoretic. No erythema.  Psychiatric: He has a normal mood and affect.    ED Course  Procedures (including critical care time) Labs Review Labs Reviewed  URINALYSIS,  ROUTINE W REFLEX MICROSCOPIC - Abnormal; Notable for the following:    Specific Gravity, Urine 1.038 (*)    Hgb urine dipstick TRACE (*)    Ketones, ur 15 (*)    All other components within normal limits  CBC WITH DIFFERENTIAL - Abnormal; Notable for the following:    Neutrophils Relative % 88 (*)    Lymphocytes Relative 8 (*)    All other components within normal limits  COMPREHENSIVE METABOLIC PANEL - Abnormal; Notable for the following:    Glucose, Bld 129 (*)    GFR calc non Af Amer 60 (*)    GFR calc Af Amer 69 (*)    All other components within normal limits  I-STAT CHEM 8, ED - Abnormal; Notable for the following:    Creatinine, Ser 1.50 (*)    Glucose, Bld 137 (*)    All other components within normal limits  CBG MONITORING, ED - Abnormal; Notable for the following:    Glucose-Capillary 101 (*)    All other components within normal limits  LIPASE, BLOOD  TROPONIN I  URINE MICROSCOPIC-ADD ON  TROPONIN I  Rosezena Sensor, ED    Imaging Review Dg Chest 1 View  04/12/2014   CLINICAL DATA:  Severe abdominal pain.  EXAM: CHEST - 1 VIEW  COMPARISON:  Chest radiograph 10/23/2013.  FINDINGS: Low lung volumes accentuate the cardiac size which may be borderline increased. No infiltrates or failure. No effusion or pneumothorax. Thoracic aorta is uncoiled. No osseous findings. Within limits for visualization on a supine radiograph, there is no visible free air.  IMPRESSION: Borderline cardiomegaly.  No active infiltrates or failure.   Electronically Signed   By: Davonna Belling M.D.   On: 04/12/2014 18:52   Dg Abd 1 View  04/12/2014   CLINICAL DATA:  Severe abdominal pain.  EXAM: ABDOMEN - 1 VIEW  COMPARISON:  None.  FINDINGS: The bowel gas pattern is normal. No radio-opaque calculi or other significant radiographic abnormality are seen. Normal excretory pattern of contrast following CT earlier in the day.  IMPRESSION: Negative.   Electronically Signed   By: Davonna Belling M.D.   On: 04/12/2014  18:53   Ct Angio Chest Aortic Dissect W &/or W/o  04/12/2014   CLINICAL DATA:  Abdominal and chest pain.  EXAM: CT ANGIOGRAPHY CHEST, ABDOMEN AND PELVIS  TECHNIQUE: Multidetector CT imaging through the chest, abdomen and pelvis was performed using the standard protocol during bolus administration of intravenous contrast. Multiplanar reconstructed images and MIPs were obtained and reviewed to evaluate the vascular anatomy.  CONTRAST:  OMNIPAQUE IOHEXOL 300 MG/ML  SOLN  COMPARISON:  None.  FINDINGS: CTA CHEST FINDINGS  The chest wall is unremarkable. No masses or adenopathy. The bony thorax is intact.  The heart is normal in size. No pericardial effusion. No mediastinal or hilar mass or adenopathy. The aorta is normal in caliber. No dissection. The branch vessels are patent. The pulmonary arteries are grossly normal. No large central pulmonary emboli. There is contrast in the esophagus. This could be due to reflux. A small hiatal hernia is noted.  The lungs are clear of acute process. Mild dependent subpleural atelectasis. No pneumothorax.  Review of the MIP images confirms the above findings.  CTA ABDOMEN AND PELVIS FINDINGS  The aorta is normal in caliber. No dissection. The branch vessels are normal.  The solid abdominal organs are normal. No mass or inflammatory change. A small upper pole left renal calculus is noted. The gallbladder is normal. No common bowel duct dilatation.  The stomach, duodenum, small bowel and colon are grossly normal. No inflammatory changes, mass lesions or obstructive findings. The appendix is normal. No mesenteric or retroperitoneal mass or adenopathy. The bladder, prostate gland and seminal vesicles are unremarkable. No pelvic mass, adenopathy or free pelvic fluid collections. No inguinal mass or adenopathy.  The bony structures are unremarkable. Mild degenerative changes involving both hips and the lumbar spine.  Review of the MIP images confirms the above findings.   IMPRESSION: Normal thoracic and abdominal aorta and branch vessels.  Small hiatal hernia and possible GE reflux with contrast noted in the mid esophagus.  No acute pulmonary findings.  No acute abdominal/ pelvic findings.   Electronically Signed   By: Loralie Champagne M.D.   On: 04/12/2014 18:38   Ct Cta Abd/pel W/cm &/or W/o Cm  04/12/2014   CLINICAL DATA:  Abdominal and chest pain.  EXAM: CT ANGIOGRAPHY CHEST, ABDOMEN AND PELVIS  TECHNIQUE: Multidetector CT imaging through the chest, abdomen and pelvis was performed using the standard protocol during bolus administration of intravenous contrast. Multiplanar reconstructed images and MIPs were obtained and reviewed to evaluate the vascular anatomy.  CONTRAST:  OMNIPAQUE IOHEXOL 300 MG/ML  SOLN  COMPARISON:  None.  FINDINGS: CTA CHEST FINDINGS  The chest wall is unremarkable. No masses or adenopathy. The bony thorax is intact.  The heart is normal in size. No pericardial effusion. No mediastinal or hilar mass or adenopathy. The aorta is normal in caliber. No dissection. The branch vessels are patent. The pulmonary arteries are grossly normal. No large central pulmonary emboli. There is contrast in the esophagus. This could be due to reflux. A small hiatal hernia is noted.  The lungs are clear of acute process. Mild dependent subpleural atelectasis. No pneumothorax.  Review of the MIP images confirms the above findings.  CTA ABDOMEN AND PELVIS FINDINGS  The aorta is normal in caliber. No dissection. The branch vessels are normal.  The solid abdominal organs are normal. No mass or inflammatory change. A small upper pole left renal calculus is noted. The gallbladder is normal. No common bowel duct dilatation.  The stomach, duodenum, small bowel and colon are grossly normal. No inflammatory changes, mass lesions or obstructive findings. The appendix is normal. No mesenteric or retroperitoneal mass or adenopathy. The bladder, prostate gland and seminal vesicles are  unremarkable. No pelvic mass, adenopathy or free pelvic fluid collections. No inguinal mass or adenopathy.  The bony structures are unremarkable. Mild degenerative changes involving both hips and the lumbar spine.  Review of the MIP images confirms the above findings.  IMPRESSION: Normal thoracic and abdominal aorta and branch vessels.  Small hiatal hernia and possible GE reflux with contrast noted in the mid esophagus.  No acute pulmonary findings.  No acute abdominal/ pelvic findings.   Electronically Signed   By: Loralie ChampagneMark  Gallerani M.D.   On: 04/12/2014 18:38     EKG Interpretation   Date/Time:  Sunday April 12 2014 17:24:08 EDT Ventricular Rate:  68 PR Interval:  166 QRS Duration: 108 QT Interval:  399 QTC Calculation: 424 R Axis:   -145 Text Interpretation:  Sinus rhythm Multiple premature complexes, vent   Markedly posterior QRS axis No significant change since last tracing  Confirmed by DOCHERTY  MD, MEGAN ([id number]) on 04/12/2014 5:26:56 PM      EMERGENCY DEPARTMENT US ABD/AORTA EXAM Study: Limited Ultrasound of the Abdominal Aorta.  INDICATIONS:Abdominal pain Indication: Multiple views of the abdominal aorta are obtained from the diaphragmatic hiatus to the aortic bifurcation in transverse and sagittal planes with a multi- Frequency probe.  PERFORMED BY: Myself with Dr. Micheline Mazeocherty  IMAGES ARCHIVED?: Yes  FINDINGS: Maximum aortic dimensions are 2.7cm  LIMITATIONS:  Body habitus and Abdominal pain  INTERPRETATION:  No abdominal aortic aneurysm  COMMENT:  No evidence of widened aorta or aortic aneurysm on exam with aortic dimensions varying from 1.3cm to 2.7cm  MDM   Final diagnoses:  Generalized abdominal pain  Hematuria   Scott Hughes presents with abd pain, diaphoresis, irregular heartbeat, chest pain and vomiting gradual onset at 1:30PM with rapid progression.    Pt pale, diaphoretic, writhing in the bed - ill appearing.  HR irregular from 62-108 on the monitor.   Denies major medical Hx.  Pt given zofran, phenergan and dilaudid without relief.  Pt also given ASA.  No personal or family cardiac hx.  Difficult abd exam due to pain; no evidence of AAA on bedside US.    BP 130/74  Pulse 104  Temp(Src) 100 F (37.8 C) (Rectal)  Resp 16  Ht 5\' 11"  (1.803 m)  Wt 215 lb (97.523 kg)  BMI 30.00 kg/m2  SpO2 99%  BP: RUE: 183/100 LUE: 162/95  6:01 PM Concern  for thoracic/abd aortic dissection.  CTA chest/abd pending.    7:15pm CT angiogram chest and abdomen without evidence of aortic dissection. No other acute abnormality seen. No kidney stones seen. Urine pending.  8:24pm Patient continues to be in significant pain we'll redose pain medications and give Toradol. Urine with small amount of hemoglobin but no signs of infection.  Will give third fluid bolus and repeat troponin.  Patient remains significantly diaphoretic.    10:30 PM Repeat troponin negative. Patient with pain under control at this time.  NO longer diaphoertic and writhing in the bed.  Low grade fever at 100, but HTN has resolved and pt with HR at 100.  Pt reports he feels totally normal and wants to be d/c home.    Labs and scans unremarkable.  Pt to be d/c home with pain control and nausea medication to treat possible renal colic.  He is to make and appointment with his PCP and [facility] in the next 2 days for repeat exam and return to the ED for return of pain, fever, or emesis.  I have personally reviewed patient's vitals, nursing note and any pertinent labs or imaging.  I performed an undressed physical exam.    At this time, it has been determined that no acute conditions requiring further emergency intervention. The patient/guardian have been advised of the diagnosis and plan. I reviewed all labs and imaging including any potential incidental findings. We have discussed signs and symptoms that warrant return to the ED, such as those listed above.  Patient/guardian has voiced  understanding.  Vital signs are stable at discharge.   BP 130/74  Pulse 104  Temp(Src) 100 F (37.8 C) (Rectal)  Resp 16  Ht 5\' 11"  (1.803 m)  Wt 215 lb (97.523 kg)  BMI 30.00 kg/m2  SpO2 99%   The patient was discussed with and seen by Dr. Micheline Maze who agrees with the treatment plan.        Dahlia Client Akon Reinoso, PA-C 04/12/14 2322

## 2014-04-12 NOTE — ED Notes (Signed)
Per EMS pt from home at 1:30 has sharp 10/10 abd pain. Pt has not had anything to eat today. Pt has 3 episodes of vomiting. Pt diaphoretic and moaning. Pt has had 4mg  IV zofran without relief.

## 2014-04-14 NOTE — ED Provider Notes (Signed)
Medical screening examination/treatment/procedure(s) were conducted as a shared visit with non-physician practitioner(s) and myself.  I personally evaluated the patient during the encounter.   EKG Interpretation   Date/Time:  Sunday April 12 2014 17:55:24 EDT Ventricular Rate:  94 PR Interval:  174 QRS Duration: 109 QT Interval:  411 QTC Calculation: 514 R Axis:   -133 Text Interpretation:  Sinus rhythm Ventricular premature complex Markedly  posterior QRS axis Prolonged QT interval ED PHYSICIAN INTERPRETATION  AVAILABLE IN [facility] Confirmed by TEST, Record ([id number]) on  04/14/2014 7:41:46 AM      Pt presenting w/ sever central ab pain and chest pain, n/v, diaphoresis and is ill-appearing upon arrival with approx 20 pt difference between L and R arm BP.  Brief bedside echo w/ evidence of AAA.  Given above, dissection study ordered and was negative. Pt had sudden improvement during ED stay and trace blood in urine. Suspect kidney stone. Pt d/c'd home, comfortable, in NAD.   Toy CookeyMegan Docherty, MD 04/14/14 1233

## 2014-10-02 ENCOUNTER — Other Ambulatory Visit: Payer: Self-pay | Admitting: Family Medicine

## 2014-10-02 ENCOUNTER — Ambulatory Visit
Admission: RE | Admit: 2014-10-02 | Discharge: 2014-10-02 | Disposition: A | Discharge status: Home / Self Care | Payer: BLUE CROSS/BLUE SHIELD | Source: Ambulatory Visit | Attending: Family Medicine | Admitting: Family Medicine

## 2014-10-02 DIAGNOSIS — M542 Cervicalgia: Secondary | ICD-10-CM

## 2017-11-04 ENCOUNTER — Emergency Department (HOSPITAL_BASED_OUTPATIENT_CLINIC_OR_DEPARTMENT_OTHER)
Admission: EM | Admit: 2017-11-04 | Discharge: 2017-11-04 | Disposition: A | Discharge status: Home / Self Care | Payer: Self-pay | Attending: Emergency Medicine | Admitting: Emergency Medicine

## 2017-11-04 ENCOUNTER — Encounter (HOSPITAL_BASED_OUTPATIENT_CLINIC_OR_DEPARTMENT_OTHER): Payer: Self-pay | Admitting: Adult Health

## 2017-11-04 ENCOUNTER — Other Ambulatory Visit: Payer: Self-pay

## 2017-11-04 ENCOUNTER — Emergency Department (HOSPITAL_BASED_OUTPATIENT_CLINIC_OR_DEPARTMENT_OTHER): Payer: Self-pay

## 2017-11-04 DIAGNOSIS — M7022 Olecranon bursitis, left elbow: Secondary | ICD-10-CM | POA: Insufficient documentation

## 2017-11-04 DIAGNOSIS — Y9379 Activity, other specified sports and athletics: Secondary | ICD-10-CM | POA: Insufficient documentation

## 2017-11-04 DIAGNOSIS — I10 Essential (primary) hypertension: Secondary | ICD-10-CM | POA: Insufficient documentation

## 2017-11-04 DIAGNOSIS — R7989 Other specified abnormal findings of blood chemistry: Secondary | ICD-10-CM | POA: Insufficient documentation

## 2017-11-04 DIAGNOSIS — Z96651 Presence of right artificial knee joint: Secondary | ICD-10-CM | POA: Insufficient documentation

## 2017-11-04 LAB — BASIC METABOLIC PANEL
Anion gap: 8 (ref 5–15)
BUN: 15 mg/dL (ref 6–20)
CHLORIDE: 102 mmol/L (ref 101–111)
CO2: 28 mmol/L (ref 22–32)
CREATININE: 1.25 mg/dL — AB (ref 0.61–1.24)
Calcium: 9.3 mg/dL (ref 8.9–10.3)
GFR calc Af Amer: >60 mL/min (ref >60)
GFR calc non Af Amer: >60 mL/min (ref >60)
Glucose, Bld: 103 mg/dL — ABNORMAL HIGH (ref 65–99)
Potassium: 4.7 mmol/L (ref 3.5–5.1)
Sodium: 138 mmol/L (ref 135–145)

## 2017-11-04 MED ORDER — HYDROCHLOROTHIAZIDE 25 MG PO TABS
25.0000 mg | ORAL_TABLET | Freq: Once | ORAL | Status: AC
Start: 2017-11-04 — End: 2017-11-04
  Administered 2017-11-04: 25 mg via ORAL
  Filled 2017-11-04: qty 1

## 2017-11-04 MED ORDER — ACETAMINOPHEN 325 MG PO TABS
650.0000 mg | ORAL_TABLET | Freq: Once | ORAL | Status: AC
  Administered 2017-11-04: 650 mg via ORAL
  Filled 2017-11-04: qty 2

## 2017-11-04 MED ORDER — METHYLPREDNISOLONE 4 MG PO TBPK: ORAL_TABLET | ORAL | 0 refills | Status: AC

## 2017-11-04 MED ORDER — AMLODIPINE BESYLATE 5 MG PO TABS: 5.0000 mg | ORAL_TABLET | Freq: Every day | ORAL | 0 refills | Status: AC

## 2017-11-04 MED ORDER — PREDNISONE 10 MG PO TABS
60.0000 mg | ORAL_TABLET | Freq: Once | ORAL | Status: AC
  Administered 2017-11-04: 60 mg via ORAL
  Filled 2017-11-04: qty 1

## 2017-11-04 NOTE — Discharge Instructions (Signed)
Your elbow swelling is likely due to olecranon bursitis, x-ray shows no fracture or dislocation.  Please use an elbow sleeve this can be purchased at [employer] or drugstores, please take steroids as prescribed.  Please avoid any ibuprofen or Aleve, you may take Tylenol but do not use any other over-the-counter pain medications.  Please follow-up with Dr. Norton BlizzardShane Hudnall regarding this issue.  Return to the ED if you have any redness, warmth to the elbow, fevers or chills.  Your blood pressure was very elevated today, please begin taking amlodipine once daily and monitor your blood pressure and record this at home.  If you develop any lightheadedness or dizziness discontinue this medication.  You will need to follow-up with primary care within a week for recheck of your blood pressure.  If you develop any chest pain, shortness of breath, headaches, vision changes, numbness tingling or weakness in any of your extremities return to the emergency department.  Your kidney function was elevated today, creatinine was 1.25, it is extremely important that this value is rechecked within the week to ensure it is improving rather than getting worse.  This can be due to hypertension, it can also cause hypertension.  Please discontinue using creatinine and protein supplements as these can harm your kidneys as well.  Avoid using any NSAIDs.  Is not sure you are drinking plenty of water.

## 2017-11-04 NOTE — ED Provider Notes (Signed)
[facility] EMERGENCY DEPARTMENT Provider Note   CSN: [id number] Arrival date & time: 11/04/17  1247     History   Chief Complaint Chief Complaint  Patient presents with  . Joint Swelling    HPI Scott Hughes is a 57 y.o. male.  Scott Hughes is a 57 y.o. Male with a history of kidney stones, otherwise healthy, presents to the ED for evaluation of left elbow pain and swelling that began about 2 weeks ago, but has been getting worse since he is been going to the gym and lifting weights.  Patient reports pain is a constant dull ache that is worse with flexion and extension of the elbow.  Patient reports he occasionally has some tingling in the hand.  Patient denies any redness, warmth, fevers or chills.  No pain or swelling elsewhere in the arm.  Patient noted to be very hypertensive with blood pressure of 183/117 in the ED, no history of prior hypertension has never been on blood pressure medications.  Patient denies any chest pain, shortness of breath, headaches, vision changes, numbness or weakness in any of his extremities.  Reports he gets regular exercise and frequently lift weights, does report frequently using protein and creatine.      Past Medical History:  Diagnosis Date  . Kidney calculi     There are no active problems to display for this patient.   Past Surgical History:  Procedure Laterality Date  . ANKLE SURGERY    . KNEE ARTHROPLASTY Right        Home Medications    Prior to Admission medications   Medication Sig Start Date End Date Taking? Authorizing Provider  ibuprofen (ADVIL,MOTRIN) 200 MG tablet Take 400 mg by mouth every 6 (six) hours as needed for headache, mild pain or moderate pain.     [provider]  Menthol, Topical Analgesic, (BENGAY EX) Apply 1 application topically daily as needed (for pain).    [provider]  ondansetron (ZOFRAN ODT) 8 MG disintegrating tablet Take 1 tablet (8 mg total) by mouth every 8  (eight) hours as needed for nausea or vomiting. 10/24/13   Marisa Severin, MD  ondansetron (ZOFRAN ODT) 8 MG disintegrating tablet 8mg  ODT q4 hours prn nausea 04/12/14   Muthersbaugh, Dahlia Client, PA-C  oxyCODONE-acetaminophen (PERCOCET/ROXICET) 5-325 MG per tablet Take 1-2 tablets by mouth every 4 (four) hours as needed for moderate pain or severe pain. 04/12/14   Muthersbaugh, Dahlia Client, PA-C  Tetrahydrozoline HCl (EYE DROPS OP) Place 2 drops into both eyes daily as needed (for eye irritation).    [provider]    Family History History reviewed. No pertinent family history.  Social History Social History   Tobacco Use  . Smoking status: Never Smoker  Substance Use Topics  . Alcohol use: No  . Drug use: No     Allergies   Patient has no known allergies.   Review of Systems Review of Systems  Constitutional: Negative for chills and fever.  HENT: Negative.   Eyes: Negative for pain and visual disturbance.  Respiratory: Negative for shortness of breath.   Cardiovascular: Negative for chest pain and palpitations.  Gastrointestinal: Negative for abdominal pain, nausea and vomiting.  Genitourinary: Negative for dysuria, flank pain and hematuria.  Musculoskeletal: Positive for arthralgias (L elbow) and joint swelling.  Skin: Negative for color change, rash and wound.  Neurological: Negative for dizziness, light-headedness and headaches.     Physical Exam Updated Vital Signs BP (!) 183/117 (BP Location: Right  Arm) Comment: Measured x2  Pulse 76   Temp 98.4 F (36.9 C) (Oral)   Resp 16   Ht 5\' 11"  (1.803 m)   Wt 93.9 kg (207 lb)   SpO2 100%   BMI 28.87 kg/m   Physical Exam  Constitutional: He appears well-developed and well-nourished. No distress.  HENT:  Head: Normocephalic and atraumatic.  Eyes: EOM are normal. Pupils are equal, round, and reactive to light. Right eye exhibits no discharge. Left eye exhibits no discharge.  Neck: Neck supple.  Cardiovascular: Normal  rate, regular rhythm, normal heart sounds and intact distal pulses.  Pulmonary/Chest: Effort normal and breath sounds normal. No stridor. No respiratory distress. He has no wheezes. He has no rales.  Abdominal: Soft. Bowel sounds are normal. He exhibits no distension and no mass. There is no tenderness. There is no guarding.  Musculoskeletal:  Focal swelling over the olecranon process of the left elbow, there is no erythema or warmth, mild tenderness, full active range of motion with some discomfort, no pain at the wrist or shoulder, 2+ radial pulse, sensation intact, 5/5 grip strength  Neurological: He is alert. Coordination normal.  Speech is clear, able to follow commands CN III-XII intact Normal strength in upper and lower extremities bilaterally including dorsiflexion and plantar flexion, strong and equal grip strength Sensation normal to light and sharp touch Moves extremities without ataxia, coordination intact  Skin: Skin is warm and dry. Capillary refill takes less than 2 seconds. He is not diaphoretic.  Psychiatric: He has a normal mood and affect. His behavior is normal.  Nursing note and vitals reviewed.    ED Treatments / Results  Labs (all labs ordered are listed, but only abnormal results are displayed) Labs Reviewed - No data to display  EKG  EKG Interpretation None       Radiology Dg Elbow Complete Left  Result Date: 11/04/2017 CLINICAL DATA:  Left posterior elbow swelling EXAM: LEFT ELBOW - COMPLETE 3+ VIEW COMPARISON:  None. FINDINGS: No fracture or dislocation is seen. The joint spaces are preserved. Soft tissue swelling overlying the olecranon, suggesting olecranon bursitis. IMPRESSION: Soft tissue swelling overlying the olecranon, suggesting olecranon bursitis. Electronically Signed   By: Charline Bills M.D.   On: 11/04/2017 13:44    Procedures Procedures (including critical care time)  Medications Ordered in ED Medications  hydrochlorothiazide  (HYDRODIURIL) tablet 25 mg (25 mg Oral Given 11/04/17 1533)  predniSONE (DELTASONE) tablet 60 mg (60 mg Oral Given 11/04/17 1602)  acetaminophen (TYLENOL) tablet 650 mg (650 mg Oral Given 11/04/17 1602)     Initial Impression / Assessment and Plan / ED Course  I have reviewed the triage vital signs and the nursing notes.  Pertinent labs & imaging results that were available during my care of the patient were reviewed by me and considered in my medical decision making (see chart for details).  Patient presents to the ED for evaluation of left elbow pain and swelling for the past 2 weeks, worsened by weight lifting.  Left upper extremity neurovascularly intact focal swelling over the olecranon process, without erythema, warmth, fevers or chills to suggest septic arthritis.  X-ray without evidence of fracture, focal swelling over the olecranon suggestive of olecranon bursitis.  Patient noted to be very hypertensive with blood pressure of 183/117 in triage, patient denies prior history of hypertension, reports he does not routinely follow-up with a primary care provider, has never been placed on blood pressure medications.  Patient denies any symptoms to suggest hypertensive  urgency or emergency, no chest pain, shortness of breath, headache, vision changes, numbness, tingling or weakness in any extremities.  Patient does report that he does a lot of weightlifting and takes creatine and multiple protein supplements, concern for damage to hitting his kidneys with this causing hypertension.  Patient also reports increased stress from death in the family recently which could be contributing.  Will check BMP to assess kidney function.  Dose of HCTZ given in the meantime in an attempt to improve blood pressure.  Creatinine is 1.25, close is 103, but patient not fasting.  No other electrolyte derangements.  I feel this is likely chronic in building over time rather than acute kidney injury, only prior creatinine values  for comparison are in the setting of kidney stone. Given new hypertension, will treat bursitis with steroids to avoid further impacting the kidneys.  Given hypertension in the setting of elevated kidney function will start patient on amlodipine 5 mg daily, feel this will have least impact on kidneys.  Stressed the importance of follow-up within the week for blood pressure check and recheck of kidney function, patient expresses understanding.  Blood pressure does show some mild improvement after HCTZ.  At this time patient is stable for discharge home, return precautions discussed.  Patient to follow-up with Dr. Norton BlizzardShane Hudnall regarding olecranon bursitis, and to use phone number provided or follow-up with the [facility], again stressed importance of follow-up within a week for recheck of kidney function.  Patient discussed with Dr. Fredderick PhenixBelfi who is in agreement with plan.  Vitals:   11/04/17 1302 11/04/17 1506 11/04/17 1627  BP: (!) 183/117 (!) 172/104 (!) 160/125  Pulse: 76 70 82  Resp: 16 16 19   Temp: 98.4 F (36.9 C)    TempSrc: Oral    SpO2: 100% 100% 100%  Weight: 93.9 kg (207 lb)    Height: 5\' 11"  (1.803 m)       Final Clinical Impressions(s) / ED Diagnoses   Final diagnoses:  Olecranon bursitis of left elbow  Hypertension, unspecified type  Creatinine elevation    ED Discharge Orders        Ordered    amLODipine (NORVASC) 5 MG tablet  Daily     11/04/17 1627    methylPREDNISolone (MEDROL DOSEPAK) 4 MG TBPK tablet     11/04/17 1627       Dartha LodgeFord, Kelsey N, [place]PA-C 11/04/17 2115    Rolan BuccoBelfi, Melanie, MD 11/05/17 [phone]

## 2017-11-04 NOTE — ED Notes (Signed)
Pt called, found to be in waiting room bathroom. Will call when ready

## 2017-11-04 NOTE — ED Triage Notes (Signed)
Presents with left elbow swelling and pain that began a few days ago. HE was lifting weights at the time.

## 2017-11-13 ENCOUNTER — Ambulatory Visit (INDEPENDENT_AMBULATORY_CARE_PROVIDER_SITE_OTHER): Payer: Self-pay | Admitting: Family Medicine

## 2017-11-13 ENCOUNTER — Encounter: Payer: Self-pay | Admitting: Family Medicine

## 2017-11-13 DIAGNOSIS — S59902A Unspecified injury of left elbow, initial encounter: Secondary | ICD-10-CM

## 2017-11-13 NOTE — Patient Instructions (Signed)
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 Rest this with sling at all times except when icing the area. ACE wrap for compression. Icing 15 minutes at a time 3-4 times a day. Ibuprofen or aleve only if needed. Follow up with me in 3 weeks for reevaluation.

## 2017-11-14 ENCOUNTER — Encounter: Payer: Self-pay | Admitting: Family Medicine

## 2017-11-14 DIAGNOSIS — S59902D Unspecified injury of left elbow, subsequent encounter: Secondary | ICD-10-CM | POA: Insufficient documentation

## 2017-11-14 NOTE — Progress Notes (Signed)
PCP: Patient, No Pcp Per  Subjective:   HPI: Patient is a 57 y.o. male here for left elbow injury.  Patient reports on February 19 he was lifting weights. He was doing curls while he was lying down and felt a pull with some swelling in the posterior aspect of his left elbow. His pain level is currently 0 out of 10. He reports that when he was in seventh grade he had a fracture of this elbow that never felt like it healed properly. He is taking prednisone Dosepak from the emergency department. Has associated swelling over the elbow that has gone down since his visit to the emergency department. No skin changes, numbness.  Past Medical History:  Diagnosis Date  . Kidney calculi     Current Outpatient Medications on File Prior to Visit  Medication Sig Dispense Refill  . amLODipine (NORVASC) 5 MG tablet Take 1 tablet (5 mg total) by mouth daily. 30 tablet 0  . ibuprofen (ADVIL,MOTRIN) 200 MG tablet Take 400 mg by mouth every 6 (six) hours as needed for headache, mild pain or moderate pain.     . Menthol, Topical Analgesic, (BENGAY EX) Apply 1 application topically daily as needed (for pain).    . methylPREDNISolone (MEDROL DOSEPAK) 4 MG TBPK tablet Take as directed 21 tablet 0  . ondansetron (ZOFRAN ODT) 8 MG disintegrating tablet Take 1 tablet (8 mg total) by mouth every 8 (eight) hours as needed for nausea or vomiting. 20 tablet 0  . ondansetron (ZOFRAN ODT) 8 MG disintegrating tablet 8mg  ODT q4 hours prn nausea 10 tablet 0  . oxyCODONE-acetaminophen (PERCOCET/ROXICET) 5-325 MG per tablet Take 1-2 tablets by mouth every 4 (four) hours as needed for moderate pain or severe pain. 20 tablet 0  . Tetrahydrozoline HCl (EYE DROPS OP) Place 2 drops into both eyes daily as needed (for eye irritation).     No current facility-administered medications on file prior to visit.     Past Surgical History:  Procedure Laterality Date  . ANKLE SURGERY    . KNEE ARTHROPLASTY Right     No Known  Allergies  Social History   Socioeconomic History  . Marital status: Single    Spouse name: Not on file  . Number of children: Not on file  . Years of education: Not on file  . Highest education level: Not on file  Social Needs  . Financial resource strain: Not on file  . Food insecurity - worry: Not on file  . Food insecurity - inability: Not on file  . Transportation needs - medical: Not on file  . Transportation needs - non-medical: Not on file  Occupational History  . Not on file  Tobacco Use  . Smoking status: Never Smoker  . Smokeless tobacco: Never Used  Substance and Sexual Activity  . Alcohol use: No  . Drug use: No  . Sexual activity: Not on file  Other Topics Concern  . Not on file  Social History Narrative  . Not on file    History reviewed. No pertinent family history.  BP (!) 146/96   Pulse 91   Ht 5\' 11"  (1.803 m)   Wt 205 lb (93 kg)   BMI 28.59 kg/m   Review of Systems: See HPI above.     Objective:  Physical Exam:  Gen: NAD, comfortable in exam room  Left elbow: Moderate swelling over olecranon.  No bruising or other deformity. Full range of motion with 5 out of 5 strength with  pain on elbow extension. Tenderness to palpation over tip of olecranon.  No other tenderness. Collateral ligaments intact. Neurovascularly intact distally.  Right elbow: No deformity. FROM with 5/5 strength. No tenderness to palpation. NVI distally.  MSK u/s left elbow:  Anechoic fluid within olecranon bursa.  Also noted cortical irregularity of olecranon process.  Triceps tendon intact with most of this attaching normally to olecranon.  Assessment & Plan:  1. Left elbow pain - independently reviewed radiographs and noted apparent avulsion proximally of olecranon process.  Also performed and reviewed muscular skeletal ultrasound also showed there is cortical irregularity of the olecranon process.  Most of the triceps tendon appears to insert normally on the  olecranon while only some of it comes into this apparent avulsed fragment.  His injury mechanism would be very unusual to have caused an avulsion here especially with the minimal amount of pain that he is having an excellent strength with elbow extension.  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  He reports that the elbow never completely felt back to normal suggestive of this.  And then when he was doing his bicep curls he likely tore some of this fibrous tissue.  I recommended treating this conservatively with a sling for 3 weeks to get him out to 6 weeks from this injury.  Ace wrap for compression with icing and ibuprofen or Aleve if needed.  He will follow-up with me for reevaluation in 3 weeks.

## 2017-11-14 NOTE — Assessment & Plan Note (Signed)
independently reviewed radiographs and noted apparent avulsion proximally of olecranon process.  Also performed and reviewed muscular skeletal ultrasound also showed there is cortical irregularity of the olecranon process.  Most of the triceps tendon appears to insert normally on the olecranon while only some of it comes into this apparent avulsed fragment.  His injury mechanism would be very unusual to have caused an avulsion here especially with the minimal amount of pain that he is having an excellent strength with elbow extension.  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  He reports that the elbow never completely felt back to normal suggestive of this.  And then when he was doing his bicep curls he likely tore some of this fibrous tissue.  I recommended treating this conservatively with a sling for 3 weeks to get him out to 6 weeks from this injury.  Ace wrap for compression with icing and ibuprofen or Aleve if needed.  He will follow-up with me for reevaluation in 3 weeks.

## 2017-12-06 ENCOUNTER — Ambulatory Visit (INDEPENDENT_AMBULATORY_CARE_PROVIDER_SITE_OTHER): Payer: Self-pay | Admitting: Family Medicine

## 2017-12-06 ENCOUNTER — Encounter: Payer: Self-pay | Admitting: Family Medicine

## 2017-12-06 DIAGNOSIS — S59902D Unspecified injury of left elbow, subsequent encounter: Secondary | ICD-10-CM

## 2017-12-06 NOTE — Patient Instructions (Signed)
You're doing better. I'd give this another 6-12 more weeks to heal before we considered aspiration/injection. Icing 15 minutes at a time 3-4 times a day as needed. Avoid resting your elbow on a hard surface. Continue with the compression. Increase weights in the gym by 10% per week at most to minimize risk of reinjury. Follow up with me only if you want to do the aspiration and injection.

## 2017-12-09 ENCOUNTER — Encounter: Payer: Self-pay | Admitting: Family Medicine

## 2017-12-09 NOTE — Progress Notes (Signed)
PCP: Patient, No Pcp Per  Subjective:   HPI: Patient is a 57 y.o. male here for left elbow injury.  3/12: Patient reports on February 19 he was lifting weights. He was doing curls while he was lying down and felt a pull with some swelling in the posterior aspect of his left elbow. His pain level is currently 0 out of 10. He reports that when he was in seventh grade he had a fracture of this elbow that never felt like it healed properly. He is taking prednisone Dosepak from the emergency department. Has associated swelling over the elbow that has gone down since his visit to the emergency department. No skin changes, numbness.  4/4: Patient reports he is doing better and is back in the gym lifting though with lower weights. Pain is 0/10 currently and only gets a soreness posterior left elbow. Used sling only briefly. No skin changes, numbness though swelling persists.  Past Medical History:  Diagnosis Date  . Kidney calculi     Current Outpatient Medications on File Prior to Visit  Medication Sig Dispense Refill  . amLODipine (NORVASC) 5 MG tablet Take 1 tablet (5 mg total) by mouth daily. 30 tablet 0  . ibuprofen (ADVIL,MOTRIN) 200 MG tablet Take 400 mg by mouth every 6 (six) hours as needed for headache, mild pain or moderate pain.     . Menthol, Topical Analgesic, (BENGAY EX) Apply 1 application topically daily as needed (for pain).    . methylPREDNISolone (MEDROL DOSEPAK) 4 MG TBPK tablet Take as directed 21 tablet 0  . ondansetron (ZOFRAN ODT) 8 MG disintegrating tablet Take 1 tablet (8 mg total) by mouth every 8 (eight) hours as needed for nausea or vomiting. 20 tablet 0  . ondansetron (ZOFRAN ODT) 8 MG disintegrating tablet 8mg  ODT q4 hours prn nausea 10 tablet 0  . oxyCODONE-acetaminophen (PERCOCET/ROXICET) 5-325 MG per tablet Take 1-2 tablets by mouth every 4 (four) hours as needed for moderate pain or severe pain. 20 tablet 0  . Tetrahydrozoline HCl (EYE DROPS OP) Place 2  drops into both eyes daily as needed (for eye irritation).     No current facility-administered medications on file prior to visit.     Past Surgical History:  Procedure Laterality Date  . ANKLE SURGERY    . KNEE ARTHROPLASTY Right     No Known Allergies  Social History   Socioeconomic History  . Marital status: Single    Spouse name: Not on file  . Number of children: Not on file  . Years of education: Not on file  . Highest education level: Not on file  Occupational History  . Not on file  Social Needs  . Financial resource strain: Not on file  . Food insecurity:    Worry: Not on file    Inability: Not on file  . Transportation needs:    Medical: Not on file    Non-medical: Not on file  Tobacco Use  . Smoking status: Never Smoker  . Smokeless tobacco: Never Used  Substance and Sexual Activity  . Alcohol use: No  . Drug use: No  . Sexual activity: Not on file  Lifestyle  . Physical activity:    Days per week: Not on file    Minutes per session: Not on file  . Stress: Not on file  Relationships  . Social connections:    Talks on phone: Not on file    Gets together: Not on file    Attends religious  service: Not on file    Active member of club or organization: Not on file    Attends meetings of clubs or organizations: Not on file    Relationship status: Not on file  . Intimate partner violence:    Fear of current or ex partner: Not on file    Emotionally abused: Not on file    Physically abused: Not on file    Forced sexual activity: Not on file  Other Topics Concern  . Not on file  Social History Narrative  . Not on file    History reviewed. No pertinent family history.  BP (!) 166/96   Pulse 81   Ht 5\' 11"  (1.803 m)   Wt 190 lb (86.2 kg)   BMI 26.50 kg/m   Review of Systems: See HPI above.     Objective:  Physical Exam:  Gen: NAD, comfortable in exam room.  Left elbow: Mild swelling over olecranon, improved from last visit.  No bruising  or other deformity. FROM with 5/5 strength and no pain now. Minimal tenderness to palpation tip of olecranon.  No other tenderness. Collateral ligaments intact. NVI distally.  MSK u/s left elbow:  Fluid still within olecranon bursa.  Cortical irregularity of olecranon persists but not as tender with ultrasound.  Triceps tendon appears normal.  Assessment & Plan:  1. 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  Clinically improved and back to working out at gym.  Encouraged icing, compression.  Advised against aspiration/injection of bursa for at least 6-12 weeks.  Also recommended only increasing triceps work by 10% per week at most to minimize risk of reinjury.  Tylenol if needed.

## 2017-12-09 NOTE — Assessment & Plan Note (Signed)
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  Clinically improved and back to working out at gym.  Encouraged icing, compression.  Advised against aspiration/injection of bursa for at least 6-12 weeks.  Also recommended only increasing triceps work by 10% per week at most to minimize risk of reinjury.  Tylenol if needed.
